# Patient Record
Sex: Female | Born: 2000 | Race: Black or African American | Hispanic: No | Marital: Single | Smoking: Never smoker
Health system: Southern US, Community
[De-identification: ages and names within clinical notes are randomized; demographics above are authoritative.]

---
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
## 2019-05-14 ENCOUNTER — Other Ambulatory Visit: Payer: Self-pay

## 2019-05-14 DIAGNOSIS — Z20822 Contact with and (suspected) exposure to covid-19: Secondary | ICD-10-CM

## 2019-05-16 LAB — NOVEL CORONAVIRUS, NAA: SARS-CoV-2, NAA: NOT DETECTED

## 2020-03-20 ENCOUNTER — Other Ambulatory Visit: Payer: Self-pay

## 2020-03-20 ENCOUNTER — Emergency Department: Payer: No Typology Code available for payment source

## 2020-03-20 ENCOUNTER — Encounter: Payer: Self-pay | Admitting: Emergency Medicine

## 2020-03-20 ENCOUNTER — Emergency Department
Admission: EM | Admit: 2020-03-20 | Discharge: 2020-03-20 | Disposition: A | Discharge status: Home / Self Care | Payer: No Typology Code available for payment source | Attending: Emergency Medicine | Admitting: Emergency Medicine

## 2020-03-20 DIAGNOSIS — M79632 Pain in left forearm: Secondary | ICD-10-CM | POA: Diagnosis not present

## 2020-03-20 DIAGNOSIS — S4992XA Unspecified injury of left shoulder and upper arm, initial encounter: Secondary | ICD-10-CM | POA: Diagnosis not present

## 2020-03-20 DIAGNOSIS — S161XXA Strain of muscle, fascia and tendon at neck level, initial encounter: Secondary | ICD-10-CM

## 2020-03-20 DIAGNOSIS — S169XXA Unspecified injury of muscle, fascia and tendon at neck level, initial encounter: Secondary | ICD-10-CM | POA: Diagnosis present

## 2020-03-20 DIAGNOSIS — G44319 Acute post-traumatic headache, not intractable: Secondary | ICD-10-CM | POA: Diagnosis not present

## 2020-03-20 MED ORDER — TRAMADOL HCL 50 MG PO TABS: 50.0000 mg | ORAL_TABLET | Freq: Four times a day (QID) | ORAL | 0 refills | Status: AC | PRN

## 2020-03-20 MED ORDER — METHOCARBAMOL 500 MG PO TABS
500.0000 mg | ORAL_TABLET | Freq: Once | ORAL | Status: AC
  Administered 2020-03-20: 500 mg via ORAL
  Filled 2020-03-20: qty 1

## 2020-03-20 MED ORDER — MELOXICAM 15 MG PO TABS
15.0000 mg | ORAL_TABLET | Freq: Every day | ORAL | 11 refills | Status: AC
Start: 2020-03-20 — End: 2021-03-20

## 2020-03-20 MED ORDER — TRAMADOL HCL 50 MG PO TABS
50.0000 mg | ORAL_TABLET | Freq: Once | ORAL | Status: AC
  Administered 2020-03-20: 50 mg via ORAL
  Filled 2020-03-20: qty 1

## 2020-03-20 MED ORDER — METHOCARBAMOL 750 MG PO TABS: 750.0000 mg | ORAL_TABLET | Freq: Four times a day (QID) | ORAL | 0 refills | Status: AC | PRN
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
